# Patient Record
Sex: Male | Born: 1995 | Race: Black or African American | Hispanic: No | Marital: Single | Smoking: Never smoker
Health system: Southern US, Community
[De-identification: ages and names within clinical notes are randomized; demographics above are authoritative.]

## PROBLEM LIST (undated history)

## (undated) DIAGNOSIS — R569 Unspecified convulsions: Secondary | ICD-10-CM

---
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
## 2017-07-28 ENCOUNTER — Other Ambulatory Visit: Payer: Self-pay

## 2017-07-28 ENCOUNTER — Encounter (HOSPITAL_COMMUNITY): Payer: Self-pay | Admitting: Emergency Medicine

## 2017-07-28 ENCOUNTER — Emergency Department (HOSPITAL_COMMUNITY)
Admission: EM | Admit: 2017-07-28 | Discharge: 2017-07-28 | Disposition: A | Discharge status: Home / Self Care | Payer: Self-pay | Attending: Emergency Medicine | Admitting: Emergency Medicine

## 2017-07-28 DIAGNOSIS — J0100 Acute maxillary sinusitis, unspecified: Secondary | ICD-10-CM

## 2017-07-28 DIAGNOSIS — H6123 Impacted cerumen, bilateral: Secondary | ICD-10-CM

## 2017-07-28 DIAGNOSIS — Z79899 Other long term (current) drug therapy: Secondary | ICD-10-CM | POA: Insufficient documentation

## 2017-07-28 LAB — MONONUCLEOSIS SCREEN: Mono Screen: NEGATIVE

## 2017-07-28 LAB — RAPID STREP SCREEN (MED CTR MEBANE ONLY): STREPTOCOCCUS, GROUP A SCREEN (DIRECT): NEGATIVE

## 2017-07-28 MED ORDER — AMOXICILLIN-POT CLAVULANATE 875-125 MG PO TABS: 1.0000 | ORAL_TABLET | Freq: Two times a day (BID) | ORAL | 0 refills | Status: DC

## 2017-07-28 MED ORDER — NEOMYCIN-POLYMYXIN-HC 3.5-10000-1 OT SUSP: 4.0000 [drp] | Freq: Three times a day (TID) | OTIC | 0 refills | Status: AC

## 2017-07-28 MED ORDER — IBUPROFEN 400 MG PO TABS
600.0000 mg | ORAL_TABLET | Freq: Once | ORAL | Status: AC
  Administered 2017-07-28: 600 mg via ORAL
  Filled 2017-07-28: qty 1

## 2017-07-28 NOTE — ED Notes (Signed)
pts ear was instilled with warm water and perioxide to soffen wax. Large amount of wax removed pt tolerated well

## 2017-07-28 NOTE — ED Notes (Addendum)
Pt complains of worsening headache today. States that he has has a sore throat and ear pain for 2 weeks.

## 2017-07-28 NOTE — ED Triage Notes (Signed)
Pt c/o sore throat and ear pain x 2 weeks. Pain with swallowing.

## 2017-07-28 NOTE — Discharge Instructions (Addendum)
Please take all of your antibiotics until finished!   You may develop abdominal discomfort or diarrhea from the antibiotic.  You may help offset this with probiotics which you can buy or get in yogurt. Do not eat or take the probiotics until 2 hours after your antibiotic. Do not take your medicine if develop an itchy rash, swelling in your mouth or lips, or difficulty breathing.   Use ear drops as prescribed.   Follow attached handouts.   Use over the counter medications including Mucinex, Flonase, and Zyrtec for your other symptoms. Take Tylenol and Ibuprofen for pain and fevers.   Follow up with PCP this week.   If you develop worsening or new concerning symptoms you can return to the emergency department for re-evaluation.

## 2017-07-28 NOTE — ED Provider Notes (Signed)
[facility]   CSN: [id number] Arrival date & time: 07/28/17  1755     History   Chief Complaint Chief Complaint  Patient presents with  . Sore Throat    HPI Adam Mendez is a 22 y.o. male who presents emergency department today for sore throat URI symptoms.  She states of the last 2 weeks he has been having sore throat with associated dysphasia, nasal congestion, rhinorrhea, ear fullness, headache that he describes as sinus pain.  He has been taking over-the-counter medications for this without relief.  He reports that he does have sick contacts but is unsure what they had.  Denies fever, chills, inability to control secretions, N/V, abdominal pain, cough, congestion, voice change, dental disease, or trauma.   HPI  Past Medical History:  Diagnosis Date  . Seizure (HCC)     There are no active problems to display for this patient.   History reviewed. No pertinent surgical history.      Home Medications    Prior to Admission medications   Medication Sig Start Date End Date Taking? Authorizing Provider  amoxicillin (AMOXIL) 500 MG capsule Take 2 capsules (1,000 mg total) by mouth 2 (two) times daily. Patient not taking: Reported on 09/29/2016 06/04/13   Niel HummerKuhner, Ross, MD  HYDROcodone-acetaminophen (NORCO/VICODIN) 5-325 MG tablet Take 1 tablet by mouth every 6 (six) hours as needed for moderate pain or severe pain. 09/29/16   Fayrene Helperran, Bowie, PA-C  omeprazole (PRILOSEC) 20 MG capsule Take 1 capsule (20 mg total) by mouth daily. 09/29/16   Fayrene Helperran, Bowie, PA-C  ranitidine (ZANTAC) 150 MG capsule Take 1 capsule (150 mg total) by mouth daily. 09/29/16   Fayrene Helperran, Bowie, PA-C    Family History No family history on file.  Social History Social History   Tobacco Use  . Smoking status: Never Smoker  . Smokeless tobacco: Never Used  Substance Use Topics  . Alcohol use: No  . Drug use: No     Allergies   Patient has no known  allergies.   Review of Systems Review of Systems  All other systems reviewed and are negative.    Physical Exam Updated Vital Signs BP 138/88 (BP Location: Right Arm)   Pulse 80   Temp 99.9 F (37.7 C) (Oral)   Resp 18   SpO2 100%   Physical Exam  Constitutional: He appears well-developed and well-nourished.  HENT:  Head: Normocephalic and atraumatic.  Right Ear: External ear normal.  Left Ear: External ear normal.  Nose: Mucosal edema and rhinorrhea present. Right sinus exhibits maxillary sinus tenderness. Right sinus exhibits no frontal sinus tenderness. Left sinus exhibits maxillary sinus tenderness. Left sinus exhibits no frontal sinus tenderness.  Mouth/Throat: Uvula is midline, oropharynx is clear and moist and mucous membranes are normal. No tonsillar exudate.  The patient has normal phonation and is in control of secretions. No stridor.  Midline uvula without edema. Soft palate rises symmetrically. Tonsillar erythema without hypertrophy or exudates. No PTA.  Mild cobblestoning.  Tongue protrusion is normal. No trismus. No creptius on neck palpation and patient has good dentition. No gingival erythema or fluctuance noted. Mucus membranes moist.  Bilateral cerumen impactions.  Eyes: Pupils are equal, round, and reactive to light. Right eye exhibits no discharge. Left eye exhibits no discharge. No scleral icterus.  Neck: Trachea normal. Neck supple. No spinous process tenderness present. No neck rigidity. Normal range of motion present.  No nuchal rigidity or meningismus  Cardiovascular: Normal rate,  regular rhythm and intact distal pulses.  No murmur heard. Pulses:      Radial pulses are 2+ on the right side, and 2+ on the left side.       Dorsalis pedis pulses are 2+ on the right side, and 2+ on the left side.       Posterior tibial pulses are 2+ on the right side, and 2+ on the left side.  No lower extremity swelling or edema. Calves symmetric in size bilaterally.   Pulmonary/Chest: Effort normal and breath sounds normal. He exhibits no tenderness.  No increased work of breathing. No accessory muscle use. Patient is sitting upright, speaking in full sentences without difficulty   Abdominal: Soft. Bowel sounds are normal. There is no splenomegaly. There is no tenderness. There is no rebound and no guarding.  Musculoskeletal: He exhibits no edema.  Lymphadenopathy:    He has no cervical adenopathy.  Neurological: He is alert.  Skin: Skin is warm and dry. No rash noted. He is not diaphoretic.  Psychiatric: He has a normal mood and affect.  Nursing note and vitals reviewed.    ED Treatments / Results  Labs (all labs ordered are listed, but only abnormal results are displayed) Labs Reviewed  RAPID STREP SCREEN (NOT AT [facility])  CULTURE, GROUP A STREP [facility])  MONONUCLEOSIS SCREEN    EKG None  Radiology No results found.  Procedures .Ear Cerumen Removal Date/Time: 07/28/2017 9:18 PM Performed by: Jacinto Halim, PA-C Authorized by: Jacinto Halim, PA-C   Consent:    Consent obtained:  Verbal   Consent given by:  Patient   Risks discussed:  Bleeding, dizziness, incomplete removal, pain, TM perforation and infection   Alternatives discussed:  No treatment Procedure details:    Location:  L ear and R ear   Procedure type: irrigation   Post-procedure details:    Inspection:  TM intact   Hearing quality:  Improved   Patient tolerance of procedure:  Tolerated well, no immediate complications   (including critical care time)   Medications Ordered in ED Medications - No data to display   Initial Impression / Assessment and Plan / ED Course  I have reviewed the triage vital signs and the nursing notes.  Pertinent labs & imaging results that were available during my care of the patient were reviewed by me and considered in my medical decision making (see chart for details).     22 y.o. male with 2 weeks of sore throat with  associated dysphasia, nasal congestion, rhinorrhea, ear fullness, headache that he describes as sinus pain.  On exam the patient is noted to have maxillary sinus pressure with mucosal edema and rhinorrhea.  Is consistent with a sinus infection.  Will treat for bacterial sinusitis given symptoms greater than 10 days.  Patient's oropharyngeal exam with erythematous tonsils without exudate.  No peritonsillar abscess.  No concern for RPA.  Strep and mono test negative.  Patient also with bilateral cerumen impaction likely causing his ear fullness.  This was treated in the department.  Will treat patient with Augmentin and recommend over-the-counter Mucinex, Flonase, Zyrtec, and Tylenol.  Also will send home on Cortisporin drops given patient's irritation after cerumen removal.  I advised the patient to follow-up with PCP this week. Specific return precautions discussed. Time was given for all questions to be answered. The patient verbalized understanding and agreement with plan. The patient appears safe for discharge home.  Final Clinical Impressions(s) / ED Diagnoses   Final diagnoses:  Acute non-recurrent maxillary sinusitis    ED Discharge Orders        Ordered    amoxicillin-clavulanate (AUGMENTIN) 875-125 MG tablet  Every 12 hours     07/28/17 2033       Princella Pellegrini 07/28/17 2120    Melene Plan, DO 07/28/17 2339

## 2017-07-31 LAB — CULTURE, GROUP A STREP (THRC)

## 2017-11-09 ENCOUNTER — Emergency Department (HOSPITAL_COMMUNITY)
Admission: EM | Admit: 2017-11-09 | Discharge: 2017-11-09 | Disposition: A | Discharge status: Home / Self Care | Payer: Medicaid Other | Attending: Emergency Medicine | Admitting: Emergency Medicine

## 2017-11-09 ENCOUNTER — Encounter (HOSPITAL_COMMUNITY): Payer: Self-pay | Admitting: Emergency Medicine

## 2017-11-09 ENCOUNTER — Other Ambulatory Visit: Payer: Self-pay

## 2017-11-09 DIAGNOSIS — R21 Rash and other nonspecific skin eruption: Secondary | ICD-10-CM | POA: Diagnosis present

## 2017-11-09 DIAGNOSIS — T7840XA Allergy, unspecified, initial encounter: Secondary | ICD-10-CM | POA: Diagnosis not present

## 2017-11-09 MED ORDER — EPINEPHRINE 0.3 MG/0.3ML IJ SOAJ
0.3000 mg | Freq: Once | INTRAMUSCULAR | Status: AC
  Administered 2017-11-09: 0.3 mg via SUBCUTANEOUS
  Filled 2017-11-09: qty 0.3

## 2017-11-09 MED ORDER — DIPHENHYDRAMINE HCL 50 MG/ML IJ SOLN
25.0000 mg | Freq: Once | INTRAMUSCULAR | Status: AC
  Administered 2017-11-09: 25 mg via INTRAVENOUS
  Filled 2017-11-09: qty 1

## 2017-11-09 MED ORDER — EPINEPHRINE 0.3 MG/0.3ML IJ SOAJ: 0.3000 mg | Freq: Once | INTRAMUSCULAR | 1 refills | Status: AC

## 2017-11-09 MED ORDER — PREDNISONE 20 MG PO TABS: 40.0000 mg | ORAL_TABLET | Freq: Every day | ORAL | 0 refills | Status: AC

## 2017-11-09 MED ORDER — FAMOTIDINE IN NACL 20-0.9 MG/50ML-% IV SOLN
20.0000 mg | Freq: Once | INTRAVENOUS | Status: AC
Start: 2017-11-09 — End: 2017-11-09
  Administered 2017-11-09: 20 mg via INTRAVENOUS
  Filled 2017-11-09: qty 50

## 2017-11-09 MED ORDER — METHYLPREDNISOLONE SODIUM SUCC 125 MG IJ SOLR
125.0000 mg | Freq: Once | INTRAMUSCULAR | Status: AC
  Administered 2017-11-09: 125 mg via INTRAVENOUS
  Filled 2017-11-09: qty 2

## 2017-11-09 NOTE — ED Provider Notes (Signed)
Pt reevaluated.  Pt feels better.  Pt reports rash has gone down.   Pt given discharge instuctions, Rx for epi pen and prednisone    Osie CheeksSofia, Kennede Lusk K, PA-C 11/09/17 0959    Lorre NickAllen, Anthony, MD 11/10/17 [phone]

## 2017-11-09 NOTE — ED Triage Notes (Signed)
Pt reports having allergic reaction that started yesterday but unknown what caused it. Hives noted throughout body.

## 2017-11-09 NOTE — ED Provider Notes (Signed)
[facility]-EMERGENCY DEPT Provider Note   CSN: [id number] Arrival date & time: 11/09/17  0444     History   Chief Complaint Chief Complaint  Patient presents with  . Allergic Reaction    HPI Adam Mendez is a 22 y.o. male.  HPI 22 year old African-American male with no pertinent past medical history presents to the emergency department today for evaluation of allergic reaction.  States that he has been having severe itching throughout the day yesterday.  States approximately 5 hours ago the rash developed to his entire body.  Patient reports swelling to his lip.  Reports some difficulty breathing at times.  He reports some nausea when he was lying down this evening.  Patient denies any history of allergic reaction.  Denies any history of allergies.  No new medications, soaps, detergents or lotions.  Patient did not take anything for his symptoms prior to arrival.  Nothing makes better or worse.  Denies any associated wheezing or vomiting.  Denies chest pain or throat closing sensation. Past Medical History:  Diagnosis Date  . Seizure (HCC)     There are no active problems to display for this patient.   History reviewed. No pertinent surgical history.      Home Medications    Prior to Admission medications   Medication Sig Start Date End Date Taking? Authorizing Provider  amoxicillin (AMOXIL) 500 MG capsule Take 2 capsules (1,000 mg total) by mouth 2 (two) times daily. Patient not taking: Reported on 09/29/2016 06/04/13   Niel HummerKuhner, Ross, MD  amoxicillin-clavulanate (AUGMENTIN) 875-125 MG tablet Take 1 tablet by mouth every 12 (twelve) hours. 07/28/17   Maczis, Elmer SowMichael M, PA-C  HYDROcodone-acetaminophen (NORCO/VICODIN) 5-325 MG tablet Take 1 tablet by mouth every 6 (six) hours as needed for moderate pain or severe pain. 09/29/16   Fayrene Helperran, [place], PA-C  omeprazole (PRILOSEC) 20 MG capsule Take 1 capsule (20 mg total) by mouth daily. 09/29/16   Fayrene Helperran, [place]ie, PA-C    ranitidine (ZANTAC) 150 MG capsule Take 1 capsule (150 mg total) by mouth daily. 09/29/16   Fayrene Helperran, [place]ie, PA-C    Family History History reviewed. No pertinent family history.  Social History Social History   Tobacco Use  . Smoking status: Never Smoker  . Smokeless tobacco: Never Used  Substance Use Topics  . Alcohol use: No  . Drug use: No     Allergies   Patient has no known allergies.   Review of Systems Review of Systems  All other systems reviewed and are negative.    Physical Exam Updated Vital Signs BP (!) 148/82 (BP Location: Left Arm)   Pulse 90   Temp 98.3 F (36.8 C) (Oral)   Resp 16   Ht 6' (1.829 m)   Wt 122.5 kg (270 lb)   SpO2 97%   BMI 36.62 kg/m   Physical Exam  Constitutional: He appears well-developed and well-nourished. No distress.  HENT:  Head: Normocephalic and atraumatic.  Edema to the lower lip.  Oropharynx is clear.  Tongue without any edema.  Managing secretions tolerating airway.  Making complete sentences .  Eyes: Right eye exhibits no discharge. Left eye exhibits no discharge. No scleral icterus.  Neck: Normal range of motion.  Pulmonary/Chest: Effort normal and breath sounds normal. No stridor. No respiratory distress. He has no wheezes. He has no rales. He exhibits no tenderness.  Musculoskeletal: Normal range of motion.  Neurological: He is alert.  Skin: Skin is warm and dry. Capillary refill takes less than  2 seconds. Rash noted. No pallor.  Patient with a urticarial-like rash to the entire body that is erythematous.  Pruritus noted.  Psychiatric: His behavior is normal. Judgment and thought content normal.  Nursing note and vitals reviewed.    ED Treatments / Results  Labs (all labs ordered are listed, but only abnormal results are displayed) Labs Reviewed - No data to display  EKG None  Radiology No results found.  Procedures Procedures (including critical care time)  Medications Ordered in ED Medications   methylPREDNISolone sodium succinate (SOLU-MEDROL) 125 mg/2 mL injection 125 mg (has no administration in time range)  diphenhydrAMINE (BENADRYL) injection 25 mg (has no administration in time range)  EPINEPHrine (EPI-PEN) injection 0.3 mg (has no administration in time range)  famotidine (PEPCID) IVPB 20 mg premix (has no administration in time range)     Initial Impression / Assessment and Plan / ED Course  I have reviewed the triage vital signs and the nursing notes.  Pertinent labs & imaging results that were available during my care of the patient were reviewed by me and considered in my medical decision making (see chart for details).     Patient presents to the ED for evaluation of allergic reaction.  On exam patient with urticarial-like hives and edema to his lower lip.  Unknown etiology of patient's reaction.  No history of same.  Patient will be given epinephrine, Solu-Medrol, Benadryl and Pepcid.  Patient will be obvious for 4 to 6 hours and recheck.  If symptoms improve without any signs or reoccurring reaction patient will be discharged home with a steroid burst and follow-up in outpatient setting with primary care and possible allergist.  No signs of anaphylactic shock at this time.  His vital signs are reassuring.  Will be signed out to oncoming provider.  Patient was reassessed at 05 45.  Hives have improved but are still present mildly.  Patient's is sleeping with normal vital signs.  Edema of lip seems to be improving.  Care handoff to PA [name]. Pt has pending at this time reassessment.  Care dicussed and plan agreed upon with oncoming PA. Pt updated on plan of care and is currently hemodynamically stable at this time with normal vs.         Final Clinical Impressions(s) / ED Diagnoses   Final diagnoses:  Allergic reaction, initial encounter    ED Discharge Orders    None       Wallace Keller 11/09/17 Madelyn Brunner, MD 11/09/17 [phone]

## 2017-11-10 ENCOUNTER — Encounter (HOSPITAL_COMMUNITY): Payer: Self-pay

## 2017-11-10 ENCOUNTER — Emergency Department (HOSPITAL_COMMUNITY)
Admission: EM | Admit: 2017-11-10 | Discharge: 2017-11-10 | Disposition: A | Discharge status: Home / Self Care | Payer: Medicaid Other | Attending: Emergency Medicine | Admitting: Emergency Medicine

## 2017-11-10 DIAGNOSIS — R21 Rash and other nonspecific skin eruption: Secondary | ICD-10-CM | POA: Insufficient documentation

## 2017-11-10 MED ORDER — SODIUM CHLORIDE 0.9 % IV BOLUS
1000.0000 mL | Freq: Once | INTRAVENOUS | Status: AC
  Administered 2017-11-10: 1000 mL via INTRAVENOUS

## 2017-11-10 MED ORDER — METHYLPREDNISOLONE SODIUM SUCC 125 MG IJ SOLR
125.0000 mg | Freq: Once | INTRAMUSCULAR | Status: AC
  Administered 2017-11-10: 125 mg via INTRAVENOUS
  Filled 2017-11-10: qty 2

## 2017-11-10 MED ORDER — DIPHENHYDRAMINE HCL 50 MG/ML IJ SOLN
25.0000 mg | Freq: Once | INTRAMUSCULAR | Status: AC
  Administered 2017-11-10: 25 mg via INTRAVENOUS
  Filled 2017-11-10: qty 1

## 2017-11-10 MED ORDER — DIPHENHYDRAMINE HCL 25 MG PO TABS: 25.0000 mg | ORAL_TABLET | Freq: Four times a day (QID) | ORAL | 0 refills | Status: AC

## 2017-11-10 MED ORDER — FAMOTIDINE IN NACL 20-0.9 MG/50ML-% IV SOLN
20.0000 mg | INTRAVENOUS | Status: AC
  Administered 2017-11-10: 20 mg via INTRAVENOUS
  Filled 2017-11-10: qty 50

## 2017-11-10 NOTE — ED Notes (Signed)
Pt's rash is cleared up and patient feels better

## 2017-11-10 NOTE — ED Triage Notes (Signed)
Pt was seen here last night for the same, he states that the rash came back tonight and he feels like he's not breathing well

## 2017-11-10 NOTE — Discharge Instructions (Signed)
Take the prescribed medication as directed.  Continue steroid taper.  Keep your epi pen with you to use if needed, if used you need to come to the ED for monitoring. Follow-up with patient care center. Return to the ED for new or worsening symptoms.

## 2017-11-10 NOTE — ED Provider Notes (Signed)
[facility]-EMERGENCY DEPT Provider Note   CSN: [id number] Arrival date & time: 11/10/17  0326     History   Chief Complaint Chief Complaint  Patient presents with  . Rash    HPI Adam Mendez is a 22 y.o. male.  The history is provided by the patient and medical records.  Rash       22 y.o. M with hx of seizures, here with generalized bodily rash.  Seen here yesterday for same, states it completely resolved but recurred again approx 3 hours ago.  States he has no idea why this keeps happening.  He reports generalized itching, scratchy throat, and "weird' sensation when swallowing.  He denies lip/tongue swelling.  No fever/chills.  No new soaps/detergents/medications or other personal care products.  No new medications or foods.  No known allergens.  No travel.  No new sheets/clothing/linens.  No hotel stays. No hx of asthma.  No one at home with similar rash.  No new pets/animals.  Past Medical History:  Diagnosis Date  . Seizure (HCC)     There are no active problems to display for this patient.   History reviewed. No pertinent surgical history.      Home Medications    Prior to Admission medications   Medication Sig Start Date End Date Taking? Authorizing Provider  predniSONE (DELTASONE) 20 MG tablet Take 2 tablets (40 mg total) by mouth daily with breakfast. 11/09/17   Leaphart, Lynann Beaver, PA-C    Family History History reviewed. No pertinent family history.  Social History Social History   Tobacco Use  . Smoking status: Never Smoker  . Smokeless tobacco: Never Used  Substance Use Topics  . Alcohol use: No  . Drug use: No     Allergies   Patient has no known allergies.   Review of Systems Review of Systems  Skin: Positive for rash.  All other systems reviewed and are negative.    Physical Exam Updated Vital Signs BP 105/86 (BP Location: Left Arm)   Pulse 78   Temp (!) 97.3 F (36.3 C) (Oral)   Resp 14   SpO2 100%     Physical Exam  Constitutional: He is oriented to person, place, and time. He appears well-developed and well-nourished.  HENT:  Head: Normocephalic and atraumatic.  Mouth/Throat: Oropharynx is clear and moist.  No lip/tongue swelling, handling secretions well, normal phonation without stridor  Eyes: Pupils are equal, round, and reactive to light. Conjunctivae and EOM are normal.  Neck: Normal range of motion.  Cardiovascular: Normal rate, regular rhythm and normal heart sounds.  Pulmonary/Chest: Effort normal and breath sounds normal. No stridor. No respiratory distress.  Abdominal: Soft. Bowel sounds are normal. There is no tenderness. There is no rebound.  Musculoskeletal: Normal range of motion.  Neurological: He is alert and oriented to person, place, and time.  Skin: Skin is warm and dry. Rash noted. Rash is urticarial.  Generalized urticarial rash sparing the palms/soles and majority of the face; signs of excoriation noted but no superimposed infection or cellulitis  Psychiatric: He has a normal mood and affect.  Nursing note and vitals reviewed.    ED Treatments / Results  Labs (all labs ordered are listed, but only abnormal results are displayed) Labs Reviewed - No data to display  EKG None  Radiology No results found.  Procedures Procedures (including critical care time)  Medications Ordered in ED Medications  methylPREDNISolone sodium succinate (SOLU-MEDROL) 125 mg/2 mL injection 125 mg (125 mg  Intravenous Given 11/10/17 0434)  famotidine (PEPCID) IVPB 20 mg premix (0 mg Intravenous Stopped 11/10/17 0438)  diphenhydrAMINE (BENADRYL) injection 25 mg (25 mg Intravenous Given 11/10/17 0433)  sodium chloride 0.9 % bolus 1,000 mL (1,000 mLs Intravenous New Bag/Given 11/10/17 0433)     Initial Impression / Assessment and Plan / ED Course  I have reviewed the triage vital signs and the nursing notes.  Pertinent labs & imaging results that were available during my care  of the patient were reviewed by me and considered in my medical decision making (see chart for details).  22 year old male here with rash of uncertain etiology.  Does not have any known triggers.  Seen here yesterday for same.  On exam he has diffuse urticarial rash without any signs of superimposed infection.  Rash spares the palms and soles.  Reports scratchy throat but has no visible lip or tongue swelling, airway is patent.  Handling secretions well, normal phonation without stridor.  Will give IV Solu-Medrol, Benadryl, Pepcid, fluid bolus.  Will monitor.  6:08 AM Rash has dissipated at this time.  Remains without airway comrpomise.  VSS.  Still unclear etiology of his recurrent rash, however patient appears stable for discharge at this time.  We will have him continue steroid taper that was given yesterday as well as Benadryl.  He has EpiPen that he can use if needed.  He understands if he uses EpiPen he will need to come to the ED for evaluation and monitoring.  I have him follow-up with the patient care center as he does not have PCP at this time.  He will return here for any new or worsening symptoms.  Final Clinical Impressions(s) / ED Diagnoses   Final diagnoses:  Rash    ED Discharge Orders        Ordered    diphenhydrAMINE (BENADRYL) 25 MG tablet  Every 6 hours     11/10/17 0610       Garlon HatchetSanders, Sarina Robleto M, PA-C 11/10/17 [id number]    Geoffery Lyonselo, Douglas, MD 11/10/17 [id number]

## 2018-03-15 ENCOUNTER — Encounter (HOSPITAL_COMMUNITY): Payer: Self-pay

## 2018-03-15 ENCOUNTER — Emergency Department (HOSPITAL_COMMUNITY): Payer: Medicaid Other

## 2018-03-15 ENCOUNTER — Emergency Department (HOSPITAL_COMMUNITY)
Admission: EM | Admit: 2018-03-15 | Discharge: 2018-03-16 | Disposition: A | Discharge status: Home / Self Care | Payer: Medicaid Other | Attending: Emergency Medicine | Admitting: Emergency Medicine

## 2018-03-15 DIAGNOSIS — S31135A Puncture wound of abdominal wall without foreign body, periumbilic region without penetration into peritoneal cavity, initial encounter: Secondary | ICD-10-CM | POA: Insufficient documentation

## 2018-03-15 DIAGNOSIS — T148XXA Other injury of unspecified body region, initial encounter: Secondary | ICD-10-CM

## 2018-03-15 DIAGNOSIS — Y9389 Activity, other specified: Secondary | ICD-10-CM | POA: Insufficient documentation

## 2018-03-15 DIAGNOSIS — Y929 Unspecified place or not applicable: Secondary | ICD-10-CM | POA: Diagnosis not present

## 2018-03-15 DIAGNOSIS — R202 Paresthesia of skin: Secondary | ICD-10-CM | POA: Insufficient documentation

## 2018-03-15 DIAGNOSIS — S51811A Laceration without foreign body of right forearm, initial encounter: Secondary | ICD-10-CM | POA: Insufficient documentation

## 2018-03-15 DIAGNOSIS — Y999 Unspecified external cause status: Secondary | ICD-10-CM | POA: Diagnosis not present

## 2018-03-15 DIAGNOSIS — S59911A Unspecified injury of right forearm, initial encounter: Secondary | ICD-10-CM | POA: Diagnosis present

## 2018-03-15 MED ORDER — LIDOCAINE-EPINEPHRINE (PF) 2 %-1:200000 IJ SOLN
INTRAMUSCULAR | Status: AC
  Administered 2018-03-15: 20 mL
  Filled 2018-03-15: qty 20

## 2018-03-15 MED ORDER — CEPHALEXIN 500 MG PO CAPS: 500.0000 mg | ORAL_CAPSULE | Freq: Four times a day (QID) | ORAL | 0 refills | Status: DC

## 2018-03-15 MED ORDER — LIDOCAINE-EPINEPHRINE 2 %-1:100000 IJ SOLN
20.0000 mL | Freq: Once | INTRAMUSCULAR | Status: DC
Start: 2018-03-15 — End: 2018-03-16
  Filled 2018-03-15: qty 20

## 2018-03-15 MED ORDER — IBUPROFEN 800 MG PO TABS: 800.0000 mg | ORAL_TABLET | Freq: Three times a day (TID) | ORAL | 0 refills | Status: AC | PRN

## 2018-03-15 MED ORDER — CEPHALEXIN 500 MG PO CAPS
500.0000 mg | ORAL_CAPSULE | Freq: Once | ORAL | Status: AC
  Administered 2018-03-15: 500 mg via ORAL
  Filled 2018-03-15: qty 1

## 2018-03-15 MED ORDER — BACITRACIN ZINC 500 UNIT/GM EX OINT
TOPICAL_OINTMENT | Freq: Two times a day (BID) | CUTANEOUS | Status: DC
  Administered 2018-03-15: 1 via TOPICAL
  Filled 2018-03-15: qty 0.9

## 2018-03-15 MED ORDER — IBUPROFEN 800 MG PO TABS
800.0000 mg | ORAL_TABLET | Freq: Once | ORAL | Status: AC
  Administered 2018-03-15: 800 mg via ORAL
  Filled 2018-03-15: qty 1
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
## 2018-03-15 NOTE — ED Notes (Signed)
Pt wound dressed and wrapped
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
## 2019-05-19 ENCOUNTER — Other Ambulatory Visit: Payer: Self-pay

## 2019-05-19 ENCOUNTER — Encounter (HOSPITAL_COMMUNITY): Payer: Self-pay | Admitting: Emergency Medicine

## 2019-05-19 ENCOUNTER — Emergency Department (HOSPITAL_COMMUNITY)
Admission: EM | Admit: 2019-05-19 | Discharge: 2019-05-19 | Disposition: A | Discharge status: Home / Self Care | Payer: Medicaid Other | Attending: Emergency Medicine | Admitting: Emergency Medicine

## 2019-05-19 DIAGNOSIS — R5383 Other fatigue: Secondary | ICD-10-CM | POA: Diagnosis not present

## 2019-05-19 DIAGNOSIS — R030 Elevated blood-pressure reading, without diagnosis of hypertension: Secondary | ICD-10-CM | POA: Diagnosis not present

## 2019-05-19 DIAGNOSIS — Z20822 Contact with and (suspected) exposure to covid-19: Secondary | ICD-10-CM | POA: Insufficient documentation

## 2019-05-19 LAB — SARS CORONAVIRUS 2 (TAT 6-24 HRS): SARS Coronavirus 2: NEGATIVE

## 2019-05-19 NOTE — Discharge Instructions (Addendum)
Your COVID test is pending; you should expect results in 2-3 days. You can access your results on your MyChart--if you test positive you should receive a phone call.  In the meantime follow CDC guidelines and quarantine, wear a mask, wash hands often.   Please take over the counter vitamin D 2000-4000 units per day. I also recommend zinc 50 mg per day for the next two weeks.   Please return to ED if you feel have difficulty breathing or have emergent, new or concerning symptoms.  Patients who have symptoms consistent with COVID-19 should self isolated for: At least 3 days (72 hours) have passed since recovery, defined as resolution of fever without the use of fever reducing medications and improvement in respiratory symptoms (e.g., cough, shortness of breath), and At least 7 days have passed since symptoms first appeared.       Person Under Monitoring Name: Adam Mendez  Location: [place] [address]   Infection Prevention Recommendations for Individuals Confirmed to have, or Being Evaluated for, 2019 Novel Coronavirus (COVID-19) Infection Who Receive Care at Home  Individuals who are confirmed to have, or are being evaluated for, COVID-19 should follow the prevention steps below until a healthcare provider or local or state health department says they can return to normal activities.  Stay home except to get medical care You should restrict activities outside your home, except for getting medical care. Do not go to work, school, or public areas, and do not use public transportation or taxis.  Call ahead before visiting your doctor Before your medical appointment, call the healthcare provider and tell them that you have, or are being evaluated for, COVID-19 infection. This will help the healthcare provider's office take steps to keep other people from getting infected. Ask your healthcare provider to call the local or state health  department.  Monitor your symptoms Seek prompt medical attention if your illness is worsening (e.g., difficulty breathing). Before going to your medical appointment, call the healthcare provider and tell them that you have, or are being evaluated for, COVID-19 infection. Ask your healthcare provider to call the local or state health department.  Wear a facemask You should wear a facemask that covers your nose and mouth when you are in the same room with other people and when you visit a healthcare provider. People who live with or visit you should also wear a facemask while they are in the same room with you.  Separate yourself from other people in your home As much as possible, you should stay in a different room from other people in your home. Also, you should use a separate bathroom, if available.  Avoid sharing household items You should not share dishes, drinking glasses, cups, eating utensils, towels, bedding, or other items with other people in your home. After using these items, you should wash them thoroughly with soap and water.  Cover your coughs and sneezes Cover your mouth and nose with a tissue when you cough or sneeze, or you can cough or sneeze into your sleeve. Throw used tissues in a lined trash can, and immediately wash your hands with soap and water for at least 20 seconds or use an alcohol-based hand rub.  Wash your [facility] your hands often and thoroughly with soap and water for at least 20 seconds. You can use an alcohol-based hand sanitizer if soap and water are not available and if your hands are not visibly dirty. Avoid touching your eyes,  nose, and mouth with unwashed hands.   Prevention Steps for Caregivers and Household Members of Individuals Confirmed to have, or Being Evaluated for, COVID-19 Infection Being Cared for in the Home  If you live with, or provide care at home for, a person confirmed to have, or being evaluated for, COVID-19  infection please follow these guidelines to prevent infection:  Follow healthcare provider's instructions Make sure that you understand and can help the patient follow any healthcare provider instructions for all care.  Provide for the patient's basic needs You should help the patient with basic needs in the home and provide support for getting groceries, prescriptions, and other personal needs.  Monitor the patient's symptoms If they are getting sicker, call his or her medical provider and tell them that the patient has, or is being evaluated for, COVID-19 infection. This will help the healthcare provider's office take steps to keep other people from getting infected. Ask the healthcare provider to call the local or state health department.  Limit the number of people who have contact with the patient If possible, have only one caregiver for the patient. Other household members should stay in another home or place of residence. If this is not possible, they should stay in another room, or be separated from the patient as much as possible. Use a separate bathroom, if available. Restrict visitors who do not have an essential need to be in the home.  Keep older adults, very young children, and other sick people away from the patient Keep older adults, very young children, and those who have compromised immune systems or chronic health conditions away from the patient. This includes people with chronic heart, lung, or kidney conditions, diabetes, and cancer.  Ensure good ventilation Make sure that shared spaces in the home have good air flow, such as from an air conditioner or an opened window, weather permitting.  Wash your hands often Wash your hands often and thoroughly with soap and water for at least 20 seconds. You can use an alcohol based hand sanitizer if soap and water are not available and if your hands are not visibly dirty. Avoid touching your eyes, nose, and mouth with  unwashed hands. Use disposable paper towels to dry your hands. If not available, use dedicated cloth towels and replace them when they become wet.  Wear a facemask and gloves Wear a disposable facemask at all times in the room and gloves when you touch or have contact with the patient's blood, body fluids, and/or secretions or excretions, such as sweat, saliva, sputum, nasal mucus, vomit, urine, or feces.  Ensure the mask fits over your nose and mouth tightly, and do not touch it during use. Throw out disposable facemasks and gloves after using them. Do not reuse. Wash your hands immediately after removing your facemask and gloves. If your personal clothing becomes contaminated, carefully remove clothing and launder. Wash your hands after handling contaminated clothing. Place all used disposable facemasks, gloves, and other waste in a lined container before disposing them with other household waste. Remove gloves and wash your hands immediately after handling these items.  Do not share dishes, glasses, or other household items with the patient Avoid sharing household items. You should not share dishes, drinking glasses, cups, eating utensils, towels, bedding, or other items with a patient who is confirmed to have, or being evaluated for, COVID-19 infection. After the person uses these items, you should wash them thoroughly with soap and water.  Wash laundry thoroughly Immediately remove  and wash clothes or bedding that have blood, body fluids, and/or secretions or excretions, such as sweat, saliva, sputum, nasal mucus, vomit, urine, or feces, on them. Wear gloves when handling laundry from the patient. Read and follow directions on labels of laundry or clothing items and detergent. In general, wash and dry with the warmest temperatures recommended on the label.  Clean all areas the individual has used often Clean all touchable surfaces, such as counters, tabletops, doorknobs, bathroom fixtures,  toilets, phones, keyboards, tablets, and bedside tables, every day. Also, clean any surfaces that may have blood, body fluids, and/or secretions or excretions on them. Wear gloves when cleaning surfaces the patient has come in contact with. Use a diluted bleach solution (e.g., dilute bleach with 1 part bleach and 10 parts water) or a household disinfectant with a label that says EPA-registered for coronaviruses. To make a bleach solution at home, add 1 tablespoon of bleach to 1 quart (4 cups) of water. For a larger supply, add  cup of bleach to 1 gallon (16 cups) of water. Read labels of cleaning products and follow recommendations provided on product labels. Labels contain instructions for safe and effective use of the cleaning product including precautions you should take when applying the product, such as wearing gloves or eye protection and making sure you have good ventilation during use of the product. Remove gloves and wash hands immediately after cleaning.  Monitor yourself for signs and symptoms of illness Caregivers and household members are considered close contacts, should monitor their health, and will be asked to limit movement outside of the home to the extent possible. Follow the monitoring steps for close contacts listed on the symptom monitoring form.   ? If you have additional questions, contact your local health department or call the epidemiologist on call at [phone] (available 24/7). ? This guidance is subject to change. For the most up-to-date guidance from [facility], please refer to their website: [employer]

## 2019-05-19 NOTE — ED Notes (Signed)
Patient given discharge instructions patient verbalizes understanding. 
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
## 2019-05-19 NOTE — ED Provider Notes (Signed)
[facility] EMERGENCY DEPARTMENT Provider Note   CSN: [id number] Arrival date & time: 05/19/19  1248     History Chief Complaint  Patient presents with  . covid exposure    Shone Leventhal is a 24 y.o. male.  HPI  Patient is 24 year old male with history of seizure but on no medication for this today for fatigue and concern for Covid infection.  Patient states he has no other symptoms and but he has a newborn at home and wants to be tested to make sure he does not infect her or any other members of the family.  Denies any cough, congestion, shortness of breath, chest pain chest tightness, headache, dizziness.  Denies any loss of taste or smell.  States that exposure occurred approximately 1 week ago.     Past Medical History:  Diagnosis Date  . Seizure (HCC)     There are no problems to display for this patient.   History reviewed. No pertinent surgical history.     No family history on file.  Social History   Tobacco Use  . Smoking status: Never Smoker  . Smokeless tobacco: Never Used  Substance Use Topics  . Alcohol use: No  . Drug use: No    Home Medications Prior to Admission medications   Medication Sig Start Date End Date Taking? Authorizing Provider  cephALEXin (KEFLEX) 500 MG capsule Take 1 capsule (500 mg total) by mouth 4 (four) times daily. 03/15/18   Tilden Fossa, MD  diphenhydrAMINE (BENADRYL) 25 MG tablet Take 1 tablet (25 mg total) by mouth every 6 (six) hours. Patient not taking: Reported on 03/15/2018 11/10/17   Garlon Hatchet, PA-C  ibuprofen (ADVIL,MOTRIN) 800 MG tablet Take 1 tablet (800 mg total) by mouth every 8 (eight) hours as needed. 03/15/18   Tilden Fossa, MD  predniSONE (DELTASONE) 20 MG tablet Take 2 tablets (40 mg total) by mouth daily with breakfast. Patient not taking: Reported on 03/15/2018 11/09/17   Rise Mu, PA-C    Allergies    Patient has no known allergies.  Review of Systems     Review of Systems  Constitutional: Positive for fatigue. Negative for chills and fever.  HENT: Negative for congestion.   Eyes: Negative for pain.  Respiratory: Negative for cough and shortness of breath.   Cardiovascular: Negative for chest pain and leg swelling.  Gastrointestinal: Negative for abdominal pain and vomiting.  Genitourinary: Negative for dysuria.  Musculoskeletal: Negative for myalgias.  Skin: Negative for rash.  Neurological: Negative for dizziness and headaches.    Physical Exam Updated Vital Signs BP (!) 151/101 (BP Location: Right Arm)   Pulse 76   Temp 98.4 F (36.9 C) (Oral)   Resp 16   SpO2 98%   Physical Exam Vitals and nursing note reviewed.  Constitutional:      General: He is not in acute distress. HENT:     Head: Normocephalic and atraumatic.     Nose: Nose normal.  Eyes:     General: No scleral icterus. Cardiovascular:     Rate and Rhythm: Normal rate and regular rhythm.     Pulses: Normal pulses.     Heart sounds: Normal heart sounds.  Pulmonary:     Effort: Pulmonary effort is normal. No respiratory distress.     Breath sounds: No wheezing.  Abdominal:     Palpations: Abdomen is soft.     Tenderness: There is no abdominal tenderness.  Musculoskeletal:     Cervical back:  Normal range of motion.     Right lower leg: No edema.     Left lower leg: No edema.  Skin:    General: Skin is warm and dry.     Capillary Refill: Capillary refill takes less than 2 seconds.  Neurological:     Mental Status: He is alert. Mental status is at baseline.  Psychiatric:        Mood and Affect: Mood normal.        Behavior: Behavior normal.     ED Results / Procedures / Treatments   Labs (all labs ordered are listed, but only abnormal results are displayed) Labs Reviewed  SARS CORONAVIRUS 2 (TAT 6-24 HRS)    EKG None  Radiology No results found.  Procedures Procedures (including critical care time)  Medications Ordered in ED Medications  - No data to display  ED Course  I have reviewed the triage vital signs and the nursing notes.  Pertinent labs & imaging results that were available during my care of the patient were reviewed by me and considered in my medical decision making (see chart for details).    MDM Rules/Calculators/A&P                      Patient is well-appearing 23 year old male in no acute distress presented today for concern about Covid exposure.  He is asymptomatic besides fatigue.  Denies any other symptoms and is well-appearing with normal vitals other than mildly elevated blood pressure.  He is asymptomatic from his blood pressure of no chest pain or shortness of breath headache or other symptoms.  Discussed with patient that his blood pressure is quite elevated and should have this rechecked primary care doctor's office.  Is understanding of this.  Understanding of discharge instructions and return precautions.  He will be tested with send out 24-hour Covid PCR.  Recommend patient quarantine until results return.  Kaelem Brach was evaluated in Emergency Department on 05/19/2019 for the symptoms described in the history of present illness. He was evaluated in the context of the global COVID-19 pandemic, which necessitated consideration that the patient might be at risk for infection with the SARS-CoV-2 virus that causes COVID-19. Institutional protocols and algorithms that pertain to the evaluation of patients at risk for COVID-19 are in a state of rapid change based on information released by regulatory bodies including the CDC and federal and state organizations. These policies and algorithms were followed during the patient's care in the ED.   Final Clinical Impression(s) / ED Diagnoses Final diagnoses:  Suspected COVID-19 virus infection    Rx / DC Orders ED Discharge Orders    None       Tedd Sias, [place] 05/19/19 1407    Sherwood Gambler, MD 05/20/19 [phone]

## 2019-05-21 ENCOUNTER — Encounter (HOSPITAL_COMMUNITY): Payer: Self-pay

## 2019-05-21 ENCOUNTER — Other Ambulatory Visit: Payer: Self-pay

## 2019-05-21 ENCOUNTER — Emergency Department (HOSPITAL_COMMUNITY)
Admission: EM | Admit: 2019-05-21 | Discharge: 2019-05-21 | Disposition: A | Discharge status: Home / Self Care | Payer: Medicaid Other | Attending: Emergency Medicine | Admitting: Emergency Medicine

## 2019-05-21 DIAGNOSIS — R519 Headache, unspecified: Secondary | ICD-10-CM | POA: Diagnosis not present

## 2019-05-21 DIAGNOSIS — R040 Epistaxis: Secondary | ICD-10-CM | POA: Insufficient documentation

## 2019-05-21 DIAGNOSIS — R5383 Other fatigue: Secondary | ICD-10-CM | POA: Insufficient documentation

## 2019-05-21 MED ORDER — IBUPROFEN 200 MG PO TABS
400.0000 mg | ORAL_TABLET | Freq: Once | ORAL | Status: AC
  Administered 2019-05-21: 400 mg via ORAL
  Filled 2019-05-21: qty 2

## 2019-05-21 NOTE — ED Provider Notes (Signed)
[place] DEPT Provider Note   CSN: [id number] Arrival date & time: 05/21/19  0740     History Chief Complaint  Patient presents with  . Headache  . Epistaxis    Adam Mendez is a 24 y.o. male.  HPI   Patient was recently seen in the emergency room on January 21 for evaluation of fatigue and concerns for possible Covid infection.  Patient had a nasal swab performed on that day.  Patient states since that time he has had some intermittent nosebleeds.  He also has headache.  He felt like he had a migraine that persisted until today.  He has a slight headache today but it is better.  Denies any fevers or chills.  No coughing.  No vomiting or diarrhea.  The nosebleed is intermittent.  He is not currently having any bleeding. Past Medical History:  Diagnosis Date  . Seizure ([place])     There are no problems to display for this patient.   History reviewed. No pertinent surgical history.     Family History  Problem Relation Age of Onset  . Diabetes Mother   . Heart failure Mother     Social History   Tobacco Use  . Smoking status: Never Smoker  . Smokeless tobacco: Never Used  Substance Use Topics  . Alcohol use: No  . Drug use: No    Home Medications Prior to Admission medications   Medication Sig Start Date End Date Taking? Authorizing Provider  cephALEXin (KEFLEX) 500 MG capsule Take 1 capsule (500 mg total) by mouth 4 (four) times daily. 03/15/18   Quintella Reichert, MD  diphenhydrAMINE (BENADRYL) 25 MG tablet Take 1 tablet (25 mg total) by mouth every 6 (six) hours. Patient not taking: Reported on 03/15/2018 11/10/17   Larene Pickett, PA-C  ibuprofen (ADVIL,MOTRIN) 800 MG tablet Take 1 tablet (800 mg total) by mouth every 8 (eight) hours as needed. 03/15/18   Quintella Reichert, MD  predniSONE (DELTASONE) 20 MG tablet Take 2 tablets (40 mg total) by mouth daily with breakfast. Patient not taking: Reported on 03/15/2018 11/09/17    Doristine Devoid, PA-C    Allergies    Patient has no known allergies.  Review of Systems   Review of Systems  All other systems reviewed and are negative.   Physical Exam Updated Vital Signs BP (!) 146/91 (BP Location: Left Arm)   Pulse 79   Temp 98.1 F (36.7 C) (Oral)   Resp 17   Ht 1.829 m (6')   Wt 117.9 kg   SpO2 100%   BMI 35.26 kg/m   Physical Exam Vitals and nursing note reviewed.  Constitutional:      General: He is not in acute distress.    Appearance: He is well-developed.  HENT:     Head: Normocephalic and atraumatic.     Right Ear: External ear normal.     Left Ear: External ear normal.     Nose: No nasal deformity or septal deviation.     Right Turbinates: Swollen.     Comments: Small amount of blood noted in the right nares, no active bleeding, mild edema of the nasal turbinates Eyes:     General: No scleral icterus.       Right eye: No discharge.        Left eye: No discharge.     Conjunctiva/sclera: Conjunctivae normal.  Neck:     Trachea: No tracheal deviation.  Cardiovascular:     Rate  and Rhythm: Normal rate and regular rhythm.  Pulmonary:     Effort: Pulmonary effort is normal. No respiratory distress.     Breath sounds: Normal breath sounds. No stridor. No wheezing or rales.  Abdominal:     General: Bowel sounds are normal. There is no distension.     Palpations: Abdomen is soft.     Tenderness: There is no abdominal tenderness. There is no guarding or rebound.  Musculoskeletal:        General: No tenderness.     Cervical back: Normal range of motion and neck supple. No rigidity.  Skin:    General: Skin is warm and dry.     Findings: No rash.  Neurological:     Mental Status: He is alert.     Cranial Nerves: No cranial nerve deficit (no facial droop, extraocular movements intact, no slurred speech).     Sensory: No sensory deficit.     Motor: No abnormal muscle tone or seizure activity.     Coordination: Coordination normal.      ED Results / Procedures / Treatments   Labs (all labs ordered are listed, but only abnormal results are displayed) Labs Reviewed - No data to display  EKG None  Radiology No results found.  Procedures Procedures (including critical care time)  Medications Ordered in ED Medications  ibuprofen (ADVIL) tablet 400 mg (has no administration in time range)    ED Course  I have reviewed the triage vital signs and the nursing notes.  Pertinent labs & imaging results that were available during my care of the patient were reviewed by me and considered in my medical decision making (see chart for details).  Clinical Course as of May 20 826  Sat May 21, 2019  7948 Covid test reviewed.  His test was negative   [JK]    Clinical Course User Index [JK] Linwood Dibbles, MD   MDM Rules/Calculators/A&P                      Patient does have some evidence of mild nosebleed following his nasal swab.  No signs of serious injury.  Patient's headache may be related to this procedure as well but no signs of meningitis and no other evidence to suggest emergent etiology to his headache.  Plan on discharge home.  Recommend use of Afrin as needed as well as saline nasal spray for the next 2 days.  Over-the-counter medications as needed for headache. Final Clinical Impression(s) / ED Diagnoses Final diagnoses:  Epistaxis    Rx / DC Orders ED Discharge Orders    None       Linwood Dibbles, MD 05/21/19 [phone]

## 2019-05-21 NOTE — Discharge Instructions (Addendum)
Take over the counter afrin nasal spray for the next 2-3 days to help with the nasal bleeding and congestion.  You can also use over the counter saline nasal spray to help keep the nose moist and irrigate the blood clots.  You can do this for as long as it is helpful.  Take tylenol or ibuprofen as needed for headache

## 2019-05-21 NOTE — ED Triage Notes (Signed)
Patient states he has had a nose bleed and headache since being covid tested 3 days ago.

## 2020-08-14 ENCOUNTER — Ambulatory Visit (HOSPITAL_COMMUNITY)
Admission: EM | Admit: 2020-08-14 | Discharge: 2020-08-14 | Disposition: A | Discharge status: Home / Self Care | Payer: Medicaid Other | Attending: Family Medicine | Admitting: Family Medicine

## 2020-08-14 ENCOUNTER — Encounter (HOSPITAL_COMMUNITY): Payer: Self-pay

## 2020-08-14 ENCOUNTER — Other Ambulatory Visit: Payer: Self-pay

## 2020-08-14 DIAGNOSIS — K920 Hematemesis: Secondary | ICD-10-CM

## 2020-08-14 DIAGNOSIS — R509 Fever, unspecified: Secondary | ICD-10-CM

## 2020-08-14 DIAGNOSIS — J029 Acute pharyngitis, unspecified: Secondary | ICD-10-CM | POA: Diagnosis present

## 2020-08-14 LAB — POCT RAPID STREP A, ED / UC: Streptococcus, Group A Screen (Direct): NEGATIVE

## 2020-08-14 MED ORDER — ACETAMINOPHEN 325 MG PO TABS
ORAL_TABLET | ORAL | Status: AC
  Filled 2020-08-14: qty 2

## 2020-08-14 MED ORDER — ONDANSETRON 4 MG PO TBDP
ORAL_TABLET | ORAL | Status: AC
  Filled 2020-08-14: qty 1

## 2020-08-14 MED ORDER — AMOXICILLIN 500 MG PO CAPS: 500.0000 mg | ORAL_CAPSULE | Freq: Two times a day (BID) | ORAL | 0 refills | Status: AC

## 2020-08-14 MED ORDER — ACETAMINOPHEN 325 MG PO TABS
650.0000 mg | ORAL_TABLET | Freq: Once | ORAL | Status: AC
  Administered 2020-08-14: 650 mg via ORAL

## 2020-08-14 MED ORDER — ONDANSETRON 4 MG PO TBDP
4.0000 mg | ORAL_TABLET | Freq: Once | ORAL | Status: AC
  Administered 2020-08-14: 4 mg via ORAL

## 2020-08-14 MED ORDER — ONDANSETRON 4 MG PO TBDP: 4.0000 mg | ORAL_TABLET | Freq: Three times a day (TID) | ORAL | 0 refills | Status: AC | PRN

## 2020-08-14 NOTE — ED Provider Notes (Signed)
[facility]    CSN: [id number] Arrival date & time: 08/14/20  1641      History   Chief Complaint Chief Complaint  Patient presents with  . Sore Throat    HPI Adam Mendez is a 25 y.o. male.   HPI   Sore Throat: Patient presents with a 4 to 5-day history of severe sore throat.  He believes he has strep pharyngitis as his sister and his niece have strep pharyngitis and he was around them recently.  He states that he has extreme pain with swallowing.  Because of this he has not been able to eat in 4 days.  He has been able to drink some liquids but he is having episodes of vomiting.  He has had a few episodes of bloody vomiting but states that this does not occur all the time and his last episode of vomiting did not appear bloody.  He denies any dark or bloody stools. No abdominal pain or chest pain.   Past Medical History:  Diagnosis Date  . Seizure (HCC)     There are no problems to display for this patient.   History reviewed. No pertinent surgical history.     Home Medications    Prior to Admission medications   Medication Sig Start Date End Date Taking? Authorizing Provider  cephALEXin (KEFLEX) 500 MG capsule Take 1 capsule (500 mg total) by mouth 4 (four) times daily. 03/15/18   Tilden Fossa, MD  diphenhydrAMINE (BENADRYL) 25 MG tablet Take 1 tablet (25 mg total) by mouth every 6 (six) hours. Patient not taking: Reported on 03/15/2018 11/10/17   Garlon Hatchet, PA-C  ibuprofen (ADVIL,MOTRIN) 800 MG tablet Take 1 tablet (800 mg total) by mouth every 8 (eight) hours as needed. 03/15/18   Tilden Fossa, MD  predniSONE (DELTASONE) 20 MG tablet Take 2 tablets (40 mg total) by mouth daily with breakfast. Patient not taking: Reported on 03/15/2018 11/09/17   Rise Mu, PA-C    Family History Family History  Problem Relation Age of Onset  . Diabetes Mother   . Heart failure Mother     Social History Social History   Tobacco Use  .  Smoking status: Never Smoker  . Smokeless tobacco: Never Used  Vaping Use  . Vaping Use: Never used  Substance Use Topics  . Alcohol use: No  . Drug use: No     Allergies   Patient has no known allergies.   Review of Systems Review of Systems  As stated above in HPI  Physical Exam Triage Vital Signs ED Triage Vitals  Enc Vitals Group     BP 08/14/20 1752 (!) 144/104     Pulse Rate 08/14/20 1752 (!) 122     Resp 08/14/20 1752 16     Temp 08/14/20 1752 (!) 103 F (39.4 C)     Temp Source 08/14/20 1752 Oral     SpO2 08/14/20 1752 98 %     Weight --      Height --      Head Circumference --      Peak Flow --      Pain Score 08/14/20 1754 10     Pain Loc --      Pain Edu? --      Excl. in GC? --    No data found.  Updated Vital Signs BP (!) 144/104 (BP Location: Right Arm)   Pulse (!) 122   Temp (!) 103 F (39.4 C) (Oral)  Resp 16   SpO2 98%   Physical Exam Vitals and nursing note reviewed.  Constitutional:      General: He is not in acute distress.    Appearance: He is well-developed. He is ill-appearing. He is not toxic-appearing or diaphoretic.  HENT:     Head: Normocephalic and atraumatic.     Comments: Eyes without pallor    Right Ear: Tympanic membrane normal. No tenderness. No middle ear effusion. Tympanic membrane is not erythematous.     Left Ear: Tympanic membrane normal. No tenderness.  No middle ear effusion. Tympanic membrane is not erythematous.     Nose: No congestion or rhinorrhea.     Mouth/Throat:     Mouth: Mucous membranes are moist.     Pharynx: Uvula midline. Oropharyngeal exudate and posterior oropharyngeal erythema present. No pharyngeal swelling or uvula swelling.     Tonsils: Tonsillar exudate present. No tonsillar abscesses. 2+ on the right. 2+ on the left.  Eyes:     Conjunctiva/sclera: Conjunctivae normal.     Pupils: Pupils are equal, round, and reactive to light.  Cardiovascular:     Rate and Rhythm: Regular rhythm.  Tachycardia present.     Heart sounds: Normal heart sounds.  Abdominal:     Palpations: Abdomen is soft.     Tenderness: There is no abdominal tenderness.  Musculoskeletal:     Cervical back: Normal range of motion and neck supple.  Lymphadenopathy:     Cervical: Cervical adenopathy present.  Skin:    General: Skin is warm.     Coloration: Skin is not pale.  Neurological:     Mental Status: He is alert.      UC Treatments / Results  Labs (all labs ordered are listed, but only abnormal results are displayed) Labs Reviewed  CULTURE, GROUP A STREP [facility])  POCT RAPID STREP A, ED / UC    EKG   Radiology No results found.  Procedures Procedures (including critical care time)  Medications Ordered in UC Medications  acetaminophen (TYLENOL) tablet 650 mg (650 mg Oral Given 08/14/20 1811)    Initial Impression / Assessment and Plan / UC Course  I have reviewed the triage vital signs and the nursing notes.  Pertinent labs & imaging results that were available during my care of the patient were reviewed by me and considered in my medical decision making (see chart for details).     New.  Clinically appears to be strep pharyngitis and his history supports this.  Treating with Amoxil to prevent further systemic illness.  We had a long discussion about red flag signs and symptoms in terms of his vomiting and his bloody emesis.  As his last 2 episodes of vomiting did not appear bloody, send him home with strict ER return precautions and antinausea medication. Fluids and rest.    Final Clinical Impressions(s) / UC Diagnoses   Final diagnoses:  None   Discharge Instructions   None    ED Prescriptions    None     PDMP not reviewed this encounter.   Rushie Chestnut, [place] 08/14/20 1843

## 2020-08-14 NOTE — ED Triage Notes (Signed)
Pt present sore throat with difficulty swallowing and mouth pain. Pt states he has not eaten anything in the last 4 days. Pt states he been vomiting as well and cannot keep anything down.

## 2020-08-15 LAB — CULTURE, GROUP A STREP (THRC)

## 2020-08-16 ENCOUNTER — Emergency Department (HOSPITAL_BASED_OUTPATIENT_CLINIC_OR_DEPARTMENT_OTHER)
Admission: EM | Admit: 2020-08-16 | Discharge: 2020-08-17 | Disposition: A | Discharge status: Home / Self Care | Payer: Medicaid Other | Attending: Emergency Medicine | Admitting: Emergency Medicine

## 2020-08-16 ENCOUNTER — Encounter (HOSPITAL_BASED_OUTPATIENT_CLINIC_OR_DEPARTMENT_OTHER): Payer: Self-pay | Admitting: *Deleted

## 2020-08-16 ENCOUNTER — Other Ambulatory Visit: Payer: Self-pay

## 2020-08-16 DIAGNOSIS — J029 Acute pharyngitis, unspecified: Secondary | ICD-10-CM

## 2020-08-16 DIAGNOSIS — J039 Acute tonsillitis, unspecified: Secondary | ICD-10-CM | POA: Insufficient documentation

## 2020-08-16 DIAGNOSIS — R070 Pain in throat: Secondary | ICD-10-CM | POA: Diagnosis present

## 2020-08-16 LAB — CULTURE, GROUP A STREP (THRC)

## 2020-08-16 NOTE — ED Triage Notes (Addendum)
Cont to c/o dental pain and Dx strep throat ,fever , vomiting x 2 days , unable to take ABX

## 2020-08-17 ENCOUNTER — Emergency Department (HOSPITAL_BASED_OUTPATIENT_CLINIC_OR_DEPARTMENT_OTHER): Payer: Medicaid Other

## 2020-08-17 LAB — COMPREHENSIVE METABOLIC PANEL
ALT: 41 U/L (ref 0–44)
AST: 28 U/L (ref 15–41)
Albumin: 3.9 g/dL (ref 3.5–5.0)
Alkaline Phosphatase: 55 U/L (ref 38–126)
Anion gap: 13 (ref 5–15)
BUN: 19 mg/dL (ref 6–20)
CO2: 28 mmol/L (ref 22–32)
Calcium: 9.2 mg/dL (ref 8.9–10.3)
Chloride: 94 mmol/L — ABNORMAL LOW (ref 98–111)
Creatinine, Ser: 1.31 mg/dL — ABNORMAL HIGH (ref 0.61–1.24)
GFR, Estimated: >60 mL/min (ref >60)
Glucose, Bld: 118 mg/dL — ABNORMAL HIGH (ref 70–99)
Potassium: 3.9 mmol/L (ref 3.5–5.1)
Sodium: 135 mmol/L (ref 135–145)
Total Bilirubin: 0.8 mg/dL (ref 0.3–1.2)
Total Protein: 8.4 g/dL — ABNORMAL HIGH (ref 6.5–8.1)

## 2020-08-17 LAB — URINALYSIS, ROUTINE W REFLEX MICROSCOPIC
Glucose, UA: 100 mg/dL — AB
Hgb urine dipstick: NEGATIVE
Ketones, ur: NEGATIVE mg/dL
Leukocytes,Ua: NEGATIVE
Nitrite: NEGATIVE
Protein, ur: 30 mg/dL — AB
Specific Gravity, Urine: 1.01 (ref 1.005–1.030)
pH: 5.5 (ref 5.0–8.0)

## 2020-08-17 LAB — CBC WITH DIFFERENTIAL/PLATELET
Abs Immature Granulocytes: 0.03 10*3/uL (ref 0.00–0.07)
Basophils Absolute: 0.1 10*3/uL (ref 0.0–0.1)
Basophils Relative: 1 %
Eosinophils Absolute: 0 10*3/uL (ref 0.0–0.5)
Eosinophils Relative: 0 %
HCT: 50.6 % (ref 39.0–52.0)
Hemoglobin: 17.1 g/dL — ABNORMAL HIGH (ref 13.0–17.0)
Immature Granulocytes: 0 %
Lymphocytes Relative: 26 %
Lymphs Abs: 3.1 10*3/uL (ref 0.7–4.0)
MCH: 28.2 pg (ref 26.0–34.0)
MCHC: 33.8 g/dL (ref 30.0–36.0)
MCV: 83.5 fL (ref 80.0–100.0)
Monocytes Absolute: 1.5 10*3/uL — ABNORMAL HIGH (ref 0.1–1.0)
Monocytes Relative: 13 %
Neutro Abs: 7.1 10*3/uL (ref 1.7–7.7)
Neutrophils Relative %: 60 %
Platelets: 218 10*3/uL (ref 150–400)
RBC: 6.06 MIL/uL — ABNORMAL HIGH (ref 4.22–5.81)
RDW: 12.3 % (ref 11.5–15.5)
Smear Review: ADEQUATE
WBC: 11.9 10*3/uL — ABNORMAL HIGH (ref 4.0–10.5)
nRBC: 0 % (ref 0.0–0.2)

## 2020-08-17 LAB — CULTURE, GROUP A STREP (THRC)

## 2020-08-17 LAB — URINALYSIS, MICROSCOPIC (REFLEX)

## 2020-08-17 MED ORDER — IOHEXOL 300 MG/ML  SOLN
100.0000 mL | Freq: Once | INTRAMUSCULAR | Status: AC | PRN
  Administered 2020-08-17: 100 mL via INTRAVENOUS

## 2020-08-17 MED ORDER — LACTATED RINGERS IV BOLUS
1000.0000 mL | Freq: Once | INTRAVENOUS | Status: AC
  Administered 2020-08-17: 1000 mL via INTRAVENOUS

## 2020-08-17 MED ORDER — DEXAMETHASONE SODIUM PHOSPHATE 10 MG/ML IJ SOLN
10.0000 mg | Freq: Once | INTRAMUSCULAR | Status: AC
  Administered 2020-08-17: 10 mg via INTRAVENOUS
  Filled 2020-08-17: qty 1

## 2020-08-17 MED ORDER — FENTANYL CITRATE (PF) 100 MCG/2ML IJ SOLN
50.0000 ug | Freq: Once | INTRAMUSCULAR | Status: AC
  Administered 2020-08-17: 50 ug via INTRAVENOUS
  Filled 2020-08-17: qty 2

## 2020-08-17 MED ORDER — SODIUM CHLORIDE 0.9 % IV SOLN
25.0000 mg | Freq: Four times a day (QID) | INTRAVENOUS | Status: DC | PRN
  Filled 2020-08-17: qty 1

## 2020-08-17 MED ORDER — DROPERIDOL 2.5 MG/ML IJ SOLN
2.5000 mg | Freq: Once | INTRAMUSCULAR | Status: AC
  Administered 2020-08-17: 2.5 mg via INTRAVENOUS
  Filled 2020-08-17: qty 2

## 2020-08-17 MED ORDER — PROMETHAZINE HCL 25 MG PO TABS: 25.0000 mg | ORAL_TABLET | Freq: Four times a day (QID) | ORAL | 0 refills | Status: AC | PRN

## 2020-08-17 MED ORDER — PENICILLIN G BENZATHINE 1200000 UNIT/2ML IM SUSP
1.2000 10*6.[IU] | Freq: Once | INTRAMUSCULAR | Status: AC
  Administered 2020-08-17: 1.2 10*6.[IU] via INTRAMUSCULAR
  Filled 2020-08-17: qty 2

## 2020-08-17 MED ORDER — PROMETHAZINE HCL 25 MG/ML IJ SOLN
INTRAMUSCULAR | Status: AC
  Administered 2020-08-17: 25 mg via INTRAVENOUS
  Filled 2020-08-17: qty 1

## 2020-08-17 MED ORDER — LIDOCAINE VISCOUS HCL 2 % MT SOLN: 15.0000 mL | OROMUCOSAL | 0 refills | Status: AC | PRN

## 2020-08-17 MED ORDER — PROMETHAZINE HCL 25 MG RE SUPP: 25.0000 mg | Freq: Four times a day (QID) | RECTAL | 0 refills | Status: AC | PRN

## 2020-08-18 NOTE — ED Provider Notes (Signed)
[facility] EMERGENCY DEPARTMENT Provider Note   CSN: [id number] Arrival date & time: 08/16/20  2322     History Chief Complaint  Patient presents with  . Vomiting    Adam Mendez is a 25 y.o. male.  25 year old male without significant past medical history who presents emergency department today for approximately 7 days of throat pain.  Patient states is progressively worsened.  Patient states he is dehydrated.  States he cannot swallow anything.  Patient states that it does not seem to be getting any better.  He was diagnosed with strep throat recently and started on antibiotics however he is unable to swallow the antibiotics.  No real vomiting just difficulty swallowing.  No abdominal pain besides when he is hungry.  Does have decreased urination.        Past Medical History:  Diagnosis Date  . Seizure (HCC)     There are no problems to display for this patient.   History reviewed. No pertinent surgical history.     Family History  Problem Relation Age of Onset  . Diabetes Mother   . Heart failure Mother     Social History   Tobacco Use  . Smoking status: Never Smoker  . Smokeless tobacco: Never Used  Vaping Use  . Vaping Use: Never used  Substance Use Topics  . Alcohol use: No  . Drug use: No    Home Medications Prior to Admission medications   Medication Sig Start Date End Date Taking? Authorizing Provider  lidocaine (XYLOCAINE) 2 % solution Use as directed 15 mLs in the mouth or throat every 4 (four) hours as needed for mouth pain (and/or before eating). 08/17/20  Yes Blondina Coderre, Barbara Cower, MD  promethazine (PHENERGAN) 25 MG suppository Place 1 suppository (25 mg total) rectally every 6 (six) hours as needed for nausea or vomiting. 08/17/20  Yes Ajene Carchi, Barbara Cower, MD  promethazine (PHENERGAN) 25 MG tablet Take 1 tablet (25 mg total) by mouth every 6 (six) hours as needed for nausea or vomiting. 08/17/20  Yes Trong Gosling, Barbara Cower, MD  amoxicillin (AMOXIL) 500 MG  capsule Take 1 capsule (500 mg total) by mouth 2 (two) times daily for 10 days. 08/14/20 08/24/20  Rushie Chestnut, PA-C  diphenhydrAMINE (BENADRYL) 25 MG tablet Take 1 tablet (25 mg total) by mouth every 6 (six) hours. Patient not taking: Reported on 03/15/2018 11/10/17   Garlon Hatchet, PA-C  ibuprofen (ADVIL,MOTRIN) 800 MG tablet Take 1 tablet (800 mg total) by mouth every 8 (eight) hours as needed. 03/15/18   Tilden Fossa, MD  ondansetron (ZOFRAN ODT) 4 MG disintegrating tablet Take 1 tablet (4 mg total) by mouth every 8 (eight) hours as needed for nausea or vomiting. 08/14/20   Rushie Chestnut, PA-C  predniSONE (DELTASONE) 20 MG tablet Take 2 tablets (40 mg total) by mouth daily with breakfast. Patient not taking: Reported on 03/15/2018 11/09/17   Rise Mu, PA-C    Allergies    Patient has no known allergies.  Review of Systems   Review of Systems  All other systems reviewed and are negative.   Physical Exam Updated Vital Signs BP 128/82   Pulse 69   Temp 100.1 F (37.8 C) (Oral)   Resp 15   Ht 6\' 3"  (1.905 m)   Wt 122.5 kg   SpO2 100%   BMI 33.75 kg/m   Physical Exam Vitals and nursing note reviewed.  Constitutional:      Appearance: He is well-developed.  HENT:  Head: Normocephalic and atraumatic.     Mouth/Throat:     Mouth: Mucous membranes are moist.     Pharynx: Oropharynx is clear.  Eyes:     Pupils: Pupils are equal, round, and reactive to light.  Cardiovascular:     Rate and Rhythm: Normal rate.  Pulmonary:     Effort: Pulmonary effort is normal. No respiratory distress.  Abdominal:     General: Abdomen is flat. There is no distension.  Musculoskeletal:        General: Normal range of motion.     Cervical back: Normal range of motion.  Skin:    General: Skin is warm and dry.     Coloration: Skin is not jaundiced or pale.  Neurological:     General: No focal deficit present.     Mental Status: He is alert.     ED Results /  Procedures / Treatments   Labs (all labs ordered are listed, but only abnormal results are displayed) Labs Reviewed  CBC WITH DIFFERENTIAL/PLATELET - Abnormal; Notable for the following components:      Result Value   WBC 11.9 (*)    RBC 6.06 (*)    Hemoglobin 17.1 (*)    Monocytes Absolute 1.5 (*)    All other components within normal limits  COMPREHENSIVE METABOLIC PANEL - Abnormal; Notable for the following components:   Chloride 94 (*)    Glucose, Bld 118 (*)    Creatinine, Ser 1.31 (*)    Total Protein 8.4 (*)    All other components within normal limits  URINALYSIS, ROUTINE W REFLEX MICROSCOPIC - Abnormal; Notable for the following components:   Glucose, UA 100 (*)    Bilirubin Urine SMALL (*)    Protein, ur 30 (*)    All other components within normal limits  URINALYSIS, MICROSCOPIC (REFLEX) - Abnormal; Notable for the following components:   Bacteria, UA RARE (*)    All other components within normal limits    EKG None  Radiology CT Soft Tissue Neck W Contrast  Result Date: 08/17/2020 CLINICAL DATA:  Dental pain and pharyngitis EXAM: CT NECK WITH CONTRAST TECHNIQUE: Multidetector CT imaging of the neck was performed using the standard protocol following the bolus administration of intravenous contrast. CONTRAST:  OMNIPAQUE IOHEXOL 300 MG/ML  SOLN COMPARISON:  None. FINDINGS: PHARYNX AND LARYNX: Edematous palatine tonsils without fluid collection. No retropharyngeal abnormality. Normal epiglottis. SALIVARY GLANDS: Normal parotid, submandibular and sublingual glands. THYROID: Normal. LYMPH NODES: Left-greater-than-right bilateral reactive lymph nodes VASCULAR: Major cervical vessels are patent. LIMITED INTRACRANIAL: Normal. VISUALIZED ORBITS: Negative. MASTOIDS AND VISUALIZED PARANASAL SINUSES: Partial opacification of the maxillary sinuses. SKELETON: Negative UPPER CHEST: Negative. OTHER: None. IMPRESSION: 1. Acute tonsillopharyngitis without peritonsillar abscess or  fluid collection. 2. Reactive cervical lymphadenopathy. Electronically Signed   By: Deatra Robinson M.D.   On: 08/17/2020 02:41    Procedures Procedures   Medications Ordered in ED Medications  fentaNYL (SUBLIMAZE) injection 50 mcg (50 mcg Intravenous Given 08/17/20 0137)  lactated ringers bolus 1,000 mL (0 mLs Intravenous Stopped 08/17/20 0303)  penicillin g benzathine (BICILLIN LA) 1200000 UNIT/2ML injection 1.2 Million Units (1.2 Million Units Intramuscular Given 08/17/20 0137)  dexamethasone (DECADRON) injection 10 mg (10 mg Intravenous Given 08/17/20 0137)  iohexol (OMNIPAQUE) 300 MG/ML solution 100 mL (100 mLs Intravenous Contrast Given 08/17/20 0216)  droperidol (INAPSINE) 2.5 MG/ML injection 2.5 mg (2.5 mg Intravenous Given 08/17/20 0320)  lactated ringers bolus 1,000 mL (0 mLs Intravenous Stopped 08/17/20 0431)  promethazine (  PHENERGAN) 25 MG/ML injection (25 mg Intravenous Given 08/17/20 0258)    ED Course  I have reviewed the triage vital signs and the nursing notes.  Pertinent labs & imaging results that were available during my care of the patient were reviewed by me and considered in my medical decision making (see chart for details).    MDM Rules/Calculators/A&P                          Secondary to the prolonged period of sore throat and difficulty swallowing because the pain a CT scan was done to evaluate for any abscesses and this was negative.  It did show tonsillopharyngitis.  Antibiotics were given.  Patient ultimately was able to tolerate p.o. pretty easily.  Fluids were given.  Patient and no vomiting.  He was discharged on medications as below.  No respiratory complaints.   Final Clinical Impression(s) / ED Diagnoses Final diagnoses:  Pharyngitis, unspecified etiology    Rx / DC Orders ED Discharge Orders         Ordered    lidocaine (XYLOCAINE) 2 % solution  Every 4 hours PRN        08/17/20 0456    promethazine (PHENERGAN) 25 MG suppository  Every 6 hours PRN         08/17/20 0456    promethazine (PHENERGAN) 25 MG tablet  Every 6 hours PRN        08/17/20 0456           Haruo Stepanek, Barbara Cower, MD 08/18/20 [id number]

## 2023-03-17 ENCOUNTER — Other Ambulatory Visit: Payer: Self-pay

## 2023-03-17 ENCOUNTER — Emergency Department (HOSPITAL_COMMUNITY)
Admission: EM | Admit: 2023-03-17 | Discharge: 2023-03-17 | Disposition: A | Discharge status: Home / Self Care | Payer: Managed Care, Other (non HMO) | Attending: Emergency Medicine | Admitting: Emergency Medicine

## 2023-03-17 ENCOUNTER — Encounter (HOSPITAL_COMMUNITY): Payer: Self-pay | Admitting: Emergency Medicine

## 2023-03-17 DIAGNOSIS — G43001 Migraine without aura, not intractable, with status migrainosus: Secondary | ICD-10-CM | POA: Insufficient documentation

## 2023-03-17 DIAGNOSIS — I10 Essential (primary) hypertension: Secondary | ICD-10-CM | POA: Insufficient documentation

## 2023-03-17 DIAGNOSIS — D72829 Elevated white blood cell count, unspecified: Secondary | ICD-10-CM | POA: Diagnosis not present

## 2023-03-17 LAB — URINALYSIS, ROUTINE W REFLEX MICROSCOPIC
Bacteria, UA: NONE SEEN
Bilirubin Urine: NEGATIVE
Glucose, UA: NEGATIVE mg/dL
Hgb urine dipstick: NEGATIVE
Ketones, ur: NEGATIVE mg/dL
Nitrite: NEGATIVE
Protein, ur: NEGATIVE mg/dL
Specific Gravity, Urine: 1.019 (ref 1.005–1.030)
pH: 6 (ref 5.0–8.0)

## 2023-03-17 LAB — COMPREHENSIVE METABOLIC PANEL
ALT: 33 U/L (ref 0–44)
AST: 22 U/L (ref 15–41)
Albumin: 4.2 g/dL (ref 3.5–5.0)
Alkaline Phosphatase: 69 U/L (ref 38–126)
Anion gap: 7 (ref 5–15)
BUN: 20 mg/dL (ref 6–20)
CO2: 22 mmol/L (ref 22–32)
Calcium: 9.1 mg/dL (ref 8.9–10.3)
Chloride: 110 mmol/L (ref 98–111)
Creatinine, Ser: 1.07 mg/dL (ref 0.61–1.24)
GFR, Estimated: >60 mL/min (ref >60)
Glucose, Bld: 108 mg/dL — ABNORMAL HIGH (ref 70–99)
Potassium: 4.4 mmol/L (ref 3.5–5.1)
Sodium: 139 mmol/L (ref 135–145)
Total Bilirubin: 0.6 mg/dL (ref <1.2)
Total Protein: 7.3 g/dL (ref 6.5–8.1)

## 2023-03-17 LAB — CBC
HCT: 43.9 % (ref 39.0–52.0)
Hemoglobin: 14.1 g/dL (ref 13.0–17.0)
MCH: 27.5 pg (ref 26.0–34.0)
MCHC: 32.1 g/dL (ref 30.0–36.0)
MCV: 85.6 fL (ref 80.0–100.0)
Platelets: 297 10*3/uL (ref 150–400)
RBC: 5.13 MIL/uL (ref 4.22–5.81)
RDW: 13.2 % (ref 11.5–15.5)
WBC: 14.2 10*3/uL — ABNORMAL HIGH (ref 4.0–10.5)
nRBC: 0 % (ref 0.0–0.2)

## 2023-03-17 LAB — LIPASE, BLOOD: Lipase: 51 U/L (ref 11–51)

## 2023-03-17 MED ORDER — KETOROLAC TROMETHAMINE 15 MG/ML IJ SOLN
15.0000 mg | Freq: Once | INTRAMUSCULAR | Status: AC
  Administered 2023-03-17: 15 mg via INTRAVENOUS
  Filled 2023-03-17: qty 1

## 2023-03-17 MED ORDER — DEXAMETHASONE SODIUM PHOSPHATE 10 MG/ML IJ SOLN
10.0000 mg | Freq: Once | INTRAMUSCULAR | Status: AC
  Administered 2023-03-17: 10 mg via INTRAMUSCULAR
  Filled 2023-03-17: qty 1

## 2023-03-17 MED ORDER — DIPHENHYDRAMINE HCL 50 MG/ML IJ SOLN
25.0000 mg | Freq: Once | INTRAMUSCULAR | Status: AC
  Administered 2023-03-17: 25 mg via INTRAVENOUS
  Filled 2023-03-17: qty 1

## 2023-03-17 MED ORDER — ACETAMINOPHEN 325 MG PO TABS
650.0000 mg | ORAL_TABLET | Freq: Once | ORAL | Status: AC
  Administered 2023-03-17: 650 mg via ORAL
  Filled 2023-03-17: qty 2

## 2023-03-17 MED ORDER — LIDOCAINE 5 % EX PTCH
1.0000 | MEDICATED_PATCH | CUTANEOUS | Status: DC
  Administered 2023-03-17: 1 via TRANSDERMAL
  Filled 2023-03-17: qty 1

## 2023-03-17 MED ORDER — SODIUM CHLORIDE 0.9 % IV BOLUS
1000.0000 mL | Freq: Once | INTRAVENOUS | Status: AC
Start: 2023-03-17 — End: 2023-03-17
  Administered 2023-03-17: 1000 mL via INTRAVENOUS

## 2023-03-17 MED ORDER — PROCHLORPERAZINE EDISYLATE 10 MG/2ML IJ SOLN
10.0000 mg | Freq: Once | INTRAMUSCULAR | Status: AC
  Administered 2023-03-17: 10 mg via INTRAVENOUS
  Filled 2023-03-17: qty 2

## 2023-03-17 NOTE — Discharge Instructions (Signed)
I am glad you are feeling better. Please follow up with your primary care provider. Seek emergency care if experiencing any new or worsening symptoms.

## 2023-03-17 NOTE — ED Triage Notes (Signed)
Patient arrives ambulatory by POV c/o having migraines over the past couple weeks. States last night decided to check his BP and it was elevated. Denies any hx of HTN. Reports dark colored stools over past few days with abdominal pain.

## 2023-03-17 NOTE — ED Provider Notes (Signed)
Adam Mendez Provider Note   CSN: [id number] Arrival date & time: 03/17/23  1058     History  Chief Complaint  Patient presents with   Migraine   Hypertension   Abdominal Pain    Adam Mendez is a 27 y.o. male with PMHx migraines and seizures who presents to ED concerned for intermittent migraines over the past 2 weeks. Migraine today is similar to past migraines. Has been taking tylenol/advil without full relief in symptoms. Patient initially thought that the migraines were d/t work and lack of sleep, but is now more concerned because he took BP at home and found it to be elevated.  Patient also with vague intermittent symptoms like being "hot all the time" for many years, waking up last night at 3am and vomiting (which has since resolved), intermittent nausea, dark colored stools. Nurse note stating that patient was complaining of abdominal pain but this is not obvious during physical exam   Denies head trauma, recent seizures, LOC, blood thinners.    Migraine Associated symptoms include headaches.  Hypertension Associated symptoms include headaches.  Abdominal Pain      Home Medications Prior to Admission medications   Medication Sig Start Date End Date Taking? Authorizing Provider  diphenhydrAMINE (BENADRYL) 25 MG tablet Take 1 tablet (25 mg total) by mouth every 6 (six) hours. Patient not taking: Reported on 03/15/2018 11/10/17   Garlon Hatchet, PA-C  ibuprofen (ADVIL,MOTRIN) 800 MG tablet Take 1 tablet (800 mg total) by mouth every 8 (eight) hours as needed. 03/15/18   Tilden Fossa, MD  lidocaine (XYLOCAINE) 2 % solution Use as directed 15 mLs in the mouth or throat every 4 (four) hours as needed for mouth pain (and/or before eating). 08/17/20   Mesner, Barbara Cower, MD  ondansetron (ZOFRAN ODT) 4 MG disintegrating tablet Take 1 tablet (4 mg total) by mouth every 8 (eight) hours as needed for nausea or vomiting. 08/14/20    Rushie Chestnut, PA-C  predniSONE (DELTASONE) 20 MG tablet Take 2 tablets (40 mg total) by mouth daily with breakfast. Patient not taking: Reported on 03/15/2018 11/09/17   Rise Mu, PA-C  promethazine (PHENERGAN) 25 MG suppository Place 1 suppository (25 mg total) rectally every 6 (six) hours as needed for nausea or vomiting. 08/17/20   Mesner, Barbara Cower, MD  promethazine (PHENERGAN) 25 MG tablet Take 1 tablet (25 mg total) by mouth every 6 (six) hours as needed for nausea or vomiting. 08/17/20   Mesner, Barbara Cower, MD      Allergies    Iodine    Review of Systems   Review of Systems  Neurological:  Positive for headaches.    Physical Exam Updated Vital Signs BP (!) 140/76 (BP Location: Right Arm)   Pulse 66   Temp 97.9 F (36.6 C)   Resp 16   Ht 6\' 3"  (1.905 m)   Wt 127 kg   SpO2 99%   BMI 35.00 kg/m  Physical Exam Vitals and nursing note reviewed.  Constitutional:      General: He is not in acute distress.    Appearance: He is not ill-appearing or toxic-appearing.  HENT:     Head: Normocephalic and atraumatic.     Mouth/Throat:     Mouth: Mucous membranes are moist.     Pharynx: No posterior oropharyngeal erythema.  Eyes:     General: No scleral icterus.       Right eye: No discharge.  Left eye: No discharge.     Extraocular Movements: Extraocular movements intact.     Conjunctiva/sclera: Conjunctivae normal.     Pupils: Pupils are equal, round, and reactive to light.  Cardiovascular:     Rate and Rhythm: Normal rate and regular rhythm.     Pulses: Normal pulses.     Heart sounds: Normal heart sounds. No murmur heard. Pulmonary:     Effort: Pulmonary effort is normal. No respiratory distress.     Breath sounds: Normal breath sounds. No wheezing, rhonchi or rales.  Abdominal:     General: Abdomen is flat. Bowel sounds are normal. There is no distension.     Palpations: Abdomen is soft. There is no mass.     Tenderness: There is no abdominal tenderness.   Musculoskeletal:     Right lower leg: No edema.     Left lower leg: No edema.  Skin:    General: Skin is warm and dry.     Findings: No rash.  Neurological:     General: No focal deficit present.     Mental Status: He is alert. Mental status is at baseline.     Comments: GCS 15. Speech is goal oriented. No deficits appreciated to CN III-XII;  Patient has equal grip strength bilaterally with 5/5 strength against resistance in all major muscle groups bilaterally. Sensation to light touch intact. Patient moves extremities without ataxia. Patient ambulatory with steady gait.    Psychiatric:        Mood and Affect: Mood normal.        Behavior: Behavior normal.     ED Results / Procedures / Treatments   Labs (all labs ordered are listed, but only abnormal results are displayed) Labs Reviewed  COMPREHENSIVE METABOLIC PANEL - Abnormal; Notable for the following components:      Result Value   Glucose, Bld 108 (*)    All other components within normal limits  CBC - Abnormal; Notable for the following components:   WBC 14.2 (*)    All other components within normal limits  URINALYSIS, ROUTINE W REFLEX MICROSCOPIC - Abnormal; Notable for the following components:   Color, Urine STRAW (*)    Leukocytes,Ua TRACE (*)    All other components within normal limits  LIPASE, BLOOD    EKG None  Radiology No results found.  Procedures Procedures    Medications Ordered in ED Medications  lidocaine (LIDODERM) 5 % 1 patch (1 patch Transdermal Patch Applied 03/17/23 1430)  sodium chloride 0.9 % bolus 1,000 mL (0 mLs Intravenous Stopped 03/17/23 1612)  ketorolac (TORADOL) 15 MG/ML injection 15 mg (15 mg Intravenous Given 03/17/23 1430)  acetaminophen (TYLENOL) tablet 650 mg (650 mg Oral Given 03/17/23 1431)  dexamethasone (DECADRON) injection 10 mg (10 mg Intramuscular Given 03/17/23 1430)  diphenhydrAMINE (BENADRYL) injection 25 mg (25 mg Intravenous Given 03/17/23 1430)   prochlorperazine (COMPAZINE) injection 10 mg (10 mg Intravenous Given 03/17/23 1430)    ED Course/ Medical Decision Making/ A&P                                 Medical Decision Making Amount and/or Complexity of Data Reviewed Labs: ordered.  Risk OTC drugs. Prescription drug management.   This patient presents to the ED for concern of headache, this involves an extensive number of treatment options, and is a complaint that carries with it a high risk of complications and morbidity.  The  differential diagnosis includes migraine, tension headache, cluster headache, subarachnoid hemorrhage, meningitis/encephalitis, acute angle closure glaucoma, giant cell arteritis, idiopathic intercranial hypertension, ischemic stroke, ICH, cervical artery dissection   Co morbidities that complicate the patient evaluation  Seizures, migraines   Additional history obtained:  [facility] PCP   Lab Tests:  I Ordered, and personally interpreted labs.  The pertinent results include:   -CBC: leukocytosis at 14.2; no anemia -lipase: within normal limits -UA: not concerning for infection -CMP: no concern for electrolyte abnormality; no concern for kidney/liver damage   Problem List / ED Course / Critical interventions / Medication management  Patient presents to ED with migraine similar to past migraines. Tylenol/Advil has not been helping. Physical and neuro exam unremarkable. Patient afebrile with stable vitals. Patient pan-positive when asking HPI questions and then relating that symptom to vague intermittent happenings over the past many years. For example, patient endorsing subjective fever and then stating that he fells "hot all the time" which has been happening for years; patient told nurse that he had abdominal pain but is not expressing pain when I palpate abdomen; patient without coughing or vomiting in ED but states that he woke up last night and had one episode of  coughing and vomiting that has since resolved; patient stating that he does have seizures but then further explained that he has not had a seizure in years and is not on medication for seizures.  CMP reassuring.  Lipase within normal notes.  UA without concern for infection.  CBC with leukocytosis at 14.2 which is non specific.  Provided patient with migraine cocktail which relieved pain. Patient requesting to go home stating that this bed here in the ED is too uncomfortable and he wants to go rest at home. I recommended following up with PCP. Patient verbalized understanding of plan. I have reviewed the patients home medicines and have made adjustments as needed Patient was given return precautions. Patient stable for discharge at this time.  Patient verbalized understanding of plan.  Ddx: these are considered less likely due to history of present illness and physical exam -subarachnoid hemorrhage: no neurodeficits, no vomiting in ED -meningitis/encephalitis: stable vital signs,  lack of meningismus symptoms -ischemic stroke/ICH/cervical artery dissection: no neurodeficits     Social Determinants of Health:  none           Final Clinical Impression(s) / ED Diagnoses Final diagnoses:  Migraine without aura and with status migrainosus, not intractable    Rx / DC Orders ED Discharge Orders     None         Dorthy Cooler, [place] 03/17/23 1834    Rozelle Logan, DO 03/18/23 1551

## 2023-04-08 ENCOUNTER — Ambulatory Visit: Payer: Managed Care, Other (non HMO) | Admitting: Physical Medicine and Rehabilitation

## 2024-02-29 ENCOUNTER — Encounter: Payer: Self-pay | Admitting: Radiology
